# Patient Record
Sex: Male | Born: 1970 | Race: White | Hispanic: No | Marital: Married | Smoking: Never smoker
Health system: Southern US, Community
[De-identification: ages and names within clinical notes are randomized; demographics above are authoritative.]

## PROBLEM LIST (undated history)

## (undated) HISTORY — PX: HAND SURGERY: SHX662

## (undated) HISTORY — PX: HERNIA REPAIR: SHX51

---

## 1998-10-20 ENCOUNTER — Emergency Department (HOSPITAL_COMMUNITY): Admission: EM | Admit: 1998-10-20 | Discharge: 1998-10-21 | Payer: Self-pay

## 2000-12-14 ENCOUNTER — Emergency Department (HOSPITAL_COMMUNITY): Admission: EM | Admit: 2000-12-14 | Discharge: 2000-12-14 | Payer: Self-pay | Admitting: Emergency Medicine

## 2002-03-16 ENCOUNTER — Emergency Department (HOSPITAL_COMMUNITY): Admission: EM | Admit: 2002-03-16 | Discharge: 2002-03-16 | Payer: Self-pay | Admitting: Emergency Medicine

## 2002-08-02 ENCOUNTER — Emergency Department (HOSPITAL_COMMUNITY): Admission: EM | Admit: 2002-08-02 | Discharge: 2002-08-02 | Payer: Self-pay | Admitting: Emergency Medicine

## 2002-08-23 ENCOUNTER — Emergency Department (HOSPITAL_COMMUNITY): Admission: EM | Admit: 2002-08-23 | Discharge: 2002-08-24 | Payer: Self-pay | Admitting: Emergency Medicine

## 2002-11-23 ENCOUNTER — Emergency Department (HOSPITAL_COMMUNITY): Admission: EM | Admit: 2002-11-23 | Discharge: 2002-11-23 | Payer: Self-pay | Admitting: Emergency Medicine

## 2003-06-22 ENCOUNTER — Emergency Department (HOSPITAL_COMMUNITY): Admission: EM | Admit: 2003-06-22 | Discharge: 2003-06-22 | Payer: Self-pay | Admitting: Emergency Medicine

## 2006-01-29 ENCOUNTER — Emergency Department (HOSPITAL_COMMUNITY): Admission: EM | Admit: 2006-01-29 | Discharge: 2006-01-29 | Payer: Self-pay | Admitting: *Deleted

## 2006-03-21 ENCOUNTER — Emergency Department (HOSPITAL_COMMUNITY): Admission: EM | Admit: 2006-03-21 | Discharge: 2006-03-21 | Payer: Self-pay | Admitting: Emergency Medicine

## 2006-06-16 ENCOUNTER — Emergency Department (HOSPITAL_COMMUNITY): Admission: EM | Admit: 2006-06-16 | Discharge: 2006-06-16 | Payer: Self-pay | Admitting: Emergency Medicine

## 2006-09-06 ENCOUNTER — Emergency Department (HOSPITAL_COMMUNITY): Admission: EM | Admit: 2006-09-06 | Discharge: 2006-09-06 | Payer: Self-pay | Admitting: Emergency Medicine

## 2006-11-25 ENCOUNTER — Emergency Department (HOSPITAL_COMMUNITY): Admission: EM | Admit: 2006-11-25 | Discharge: 2006-11-25 | Payer: Self-pay | Admitting: Emergency Medicine

## 2007-11-24 IMAGING — CT CT PELVIS W/O CM
2 of 4 series · 17 of 46 positions shown, 19 images · IV contrast (agent unspecified)
Comparison: 06/16/2006

CLINICAL DATA: Abdominal and pelvic pain and right flank pain.   Known urinary calculi 
ABDOMEN CT WITHOUT CONTRAST:
TECHNIQUE: Multidetector CT imaging of the abdomen was performed following the standard protocol without IV contrast.
TECHNIQUE: Multidetector CT imaging of the pelvis was performed following the standard protocol without IV contrast.

[Series 2: stone_wo 5.0 b40f st · axial · 0.68mm/px · z∈[-609,-253]mm · 14 of 97 slices shown, 16 images]
[im 4/97  soft-tissue]
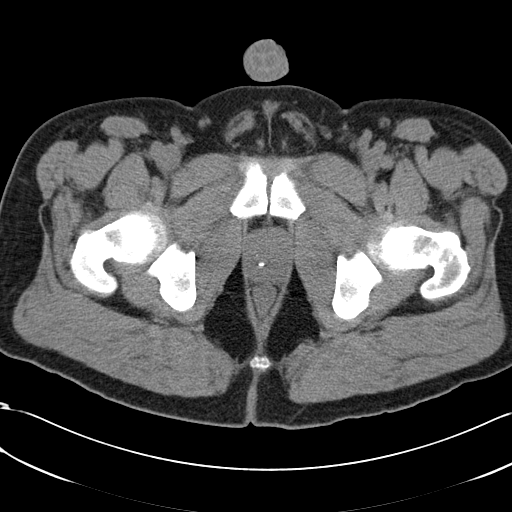
[im 4/97  bone]
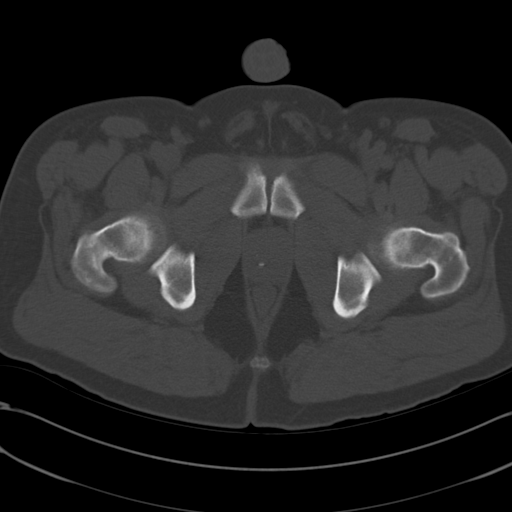
[im 11/97  soft-tissue]
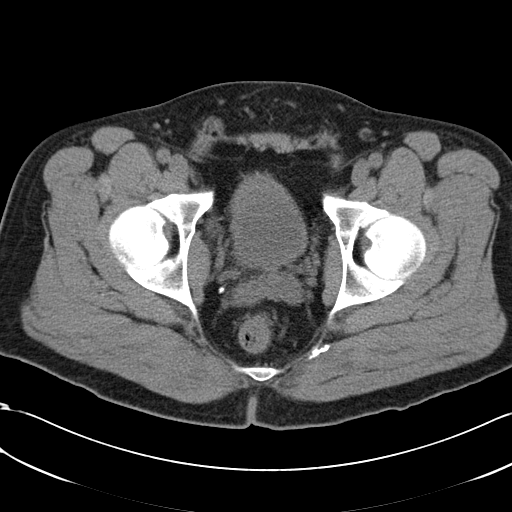
[im 18/97  soft-tissue]
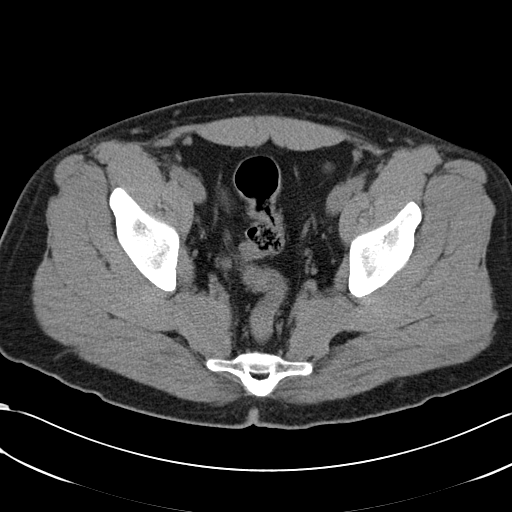
[im 25/97  soft-tissue]
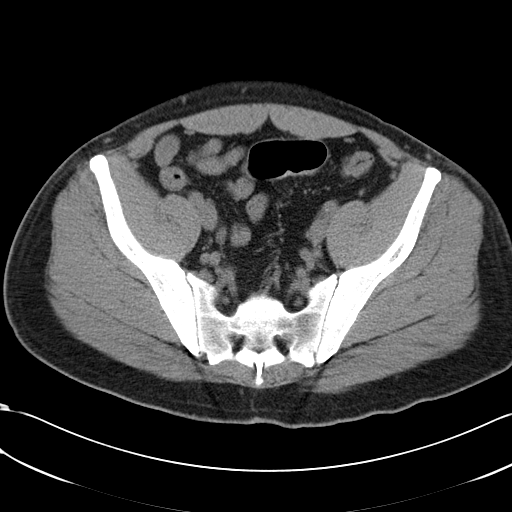
[im 33/97  soft-tissue]
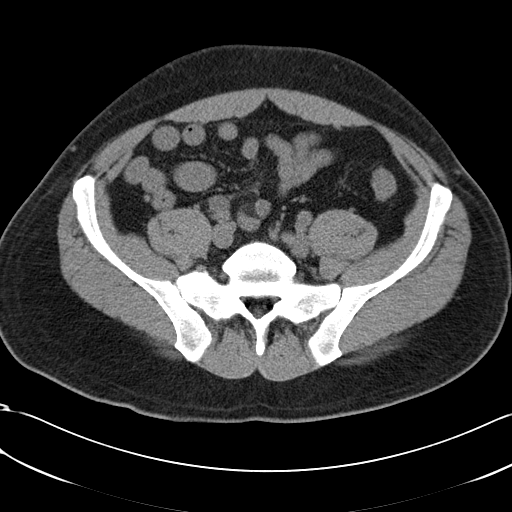
[im 40/97  soft-tissue]
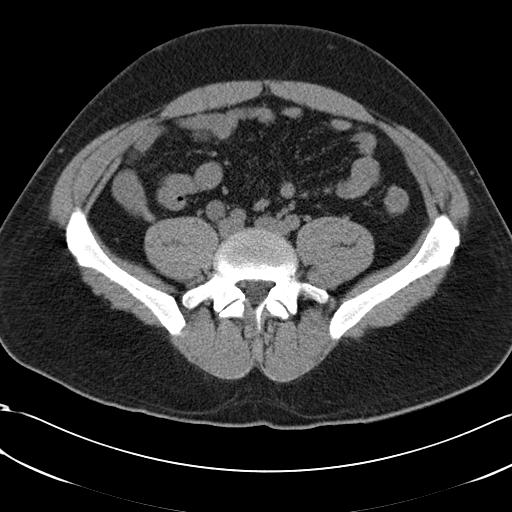
[im 47/97  soft-tissue]
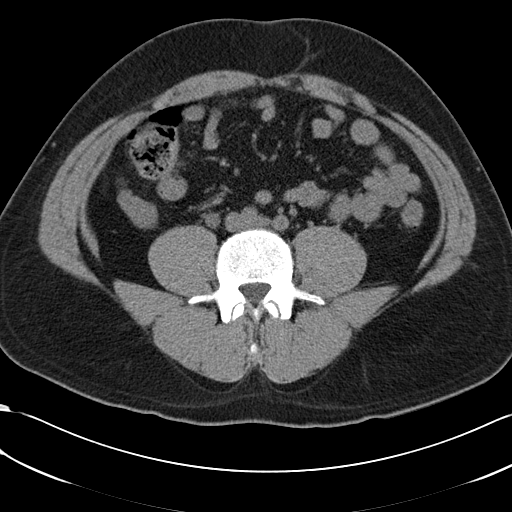
[im 50/97  soft-tissue]
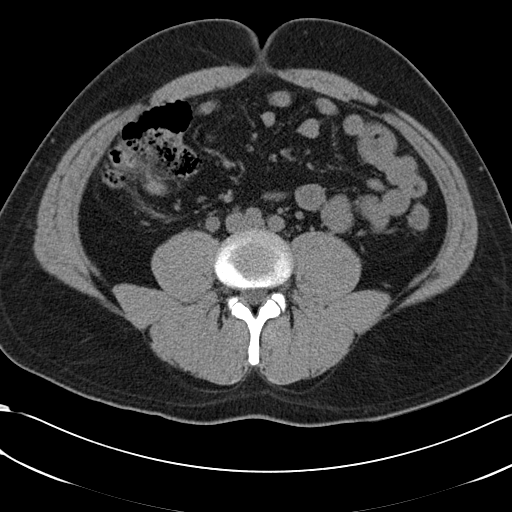
[im 57/97  soft-tissue]
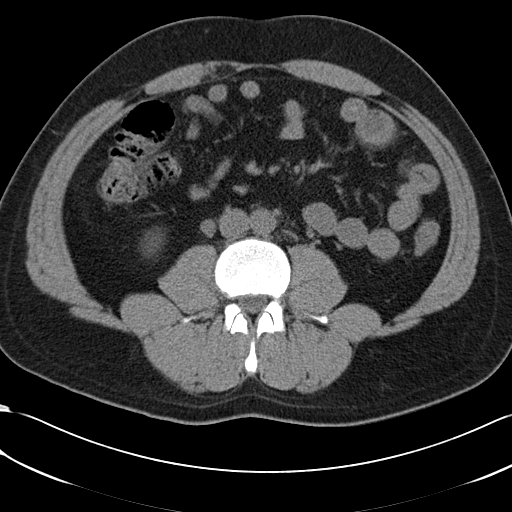
[im 57/97  bone]
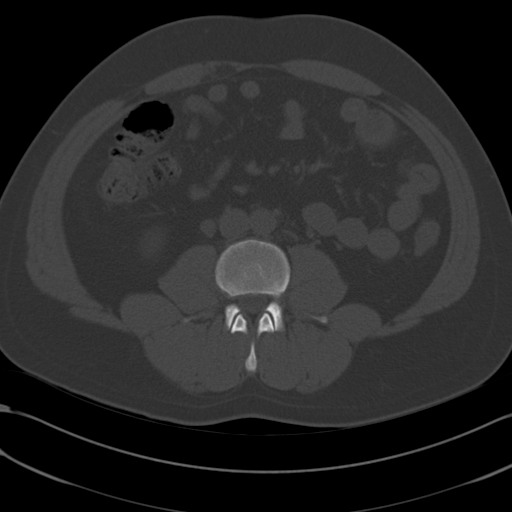
[im 65/97  soft-tissue]
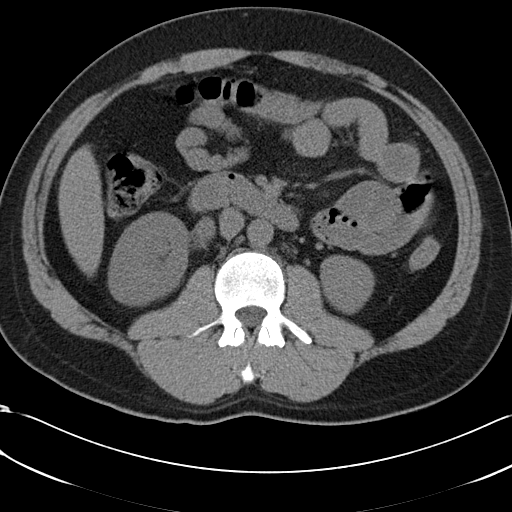
[im 72/97  soft-tissue]
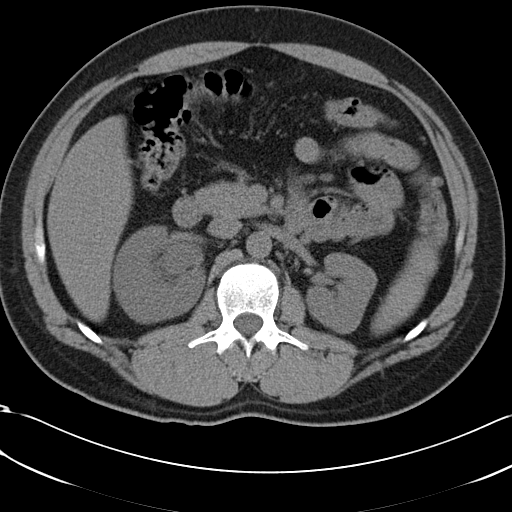
[im 79/97  soft-tissue]
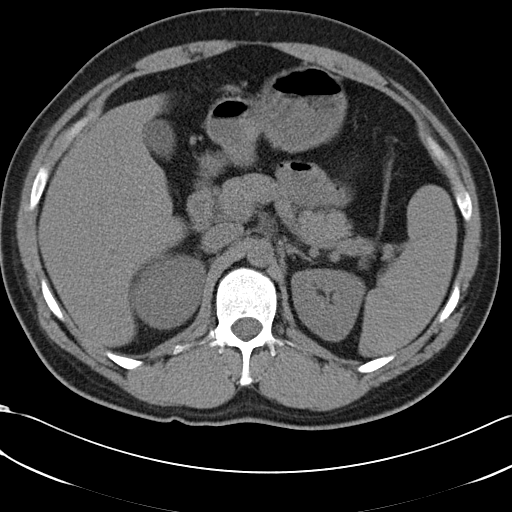
[im 86/97  soft-tissue]
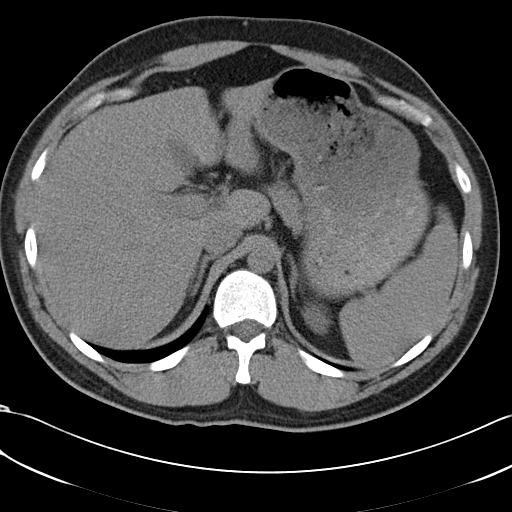
[im 93/97  soft-tissue]
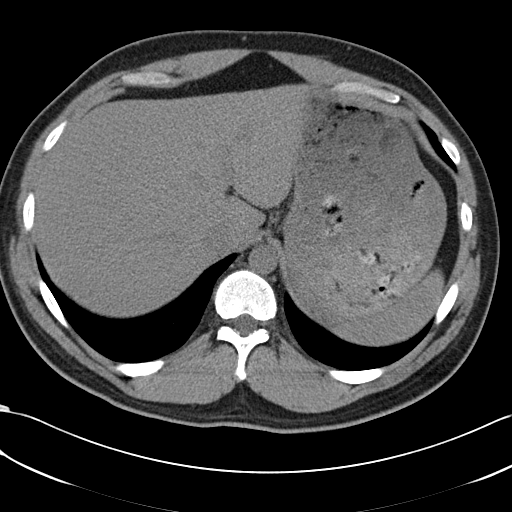

[Series 602: <mpr range> · coronal · 0.80mm/px · 3 of 74 slices shown]
[im 25/74  soft-tissue]
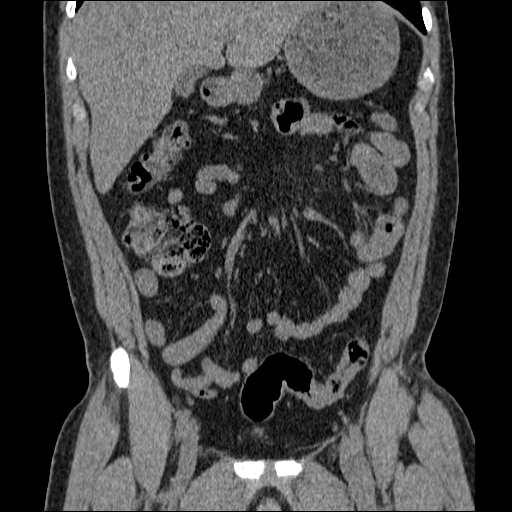
[im 33/74  soft-tissue]
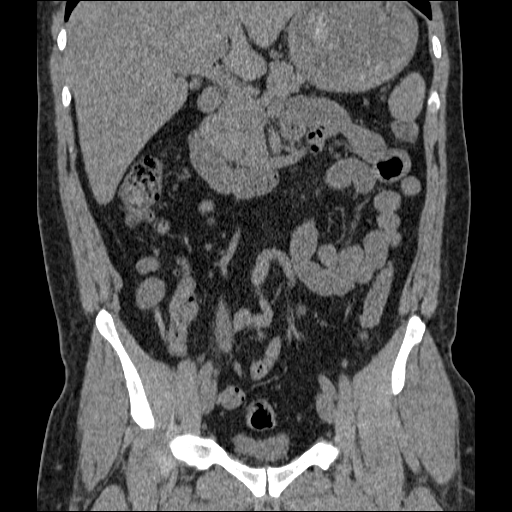
[im 41/74  soft-tissue]
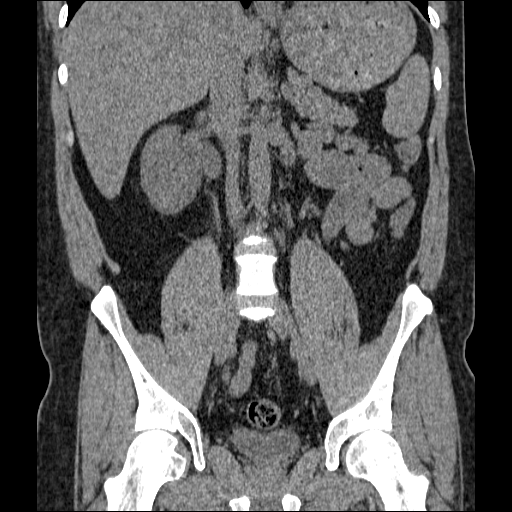

[17 of 46 positions shown; findings below may reference images not displayed]

FINDINGS: Moderate right hydroureteronephrosis is again identified caused by a 6 mm right UVJ calculus which has slightly progressed from the distal right ureter since last study. Stable punctate nonobstructing bilateral renal calculi are identified.  The visualized portions of the liver and spleen are unremarkable.  The pancreas, adrenal glands, and gallbladder are within normal limits.  Please note that parenchymal abnormalities may be missed as intravenous contrast was not administered.  No evidence of free fluid, enlarged lymph nodes, abdominal aortic aneurysm, or biliary dilatation.  The visualized bowel is within normal limits.
IMPRESSION: 1.  Stable moderate right hydroureteronephrosis caused by a 6 mm right UVJ calculus which has slightly progressed since the prior study. 
2.  Nonobstructing bilateral renal calculi.
PELVIS CT WITHOUT CONTRAST:
FINDINGS: A 6 mm right UVJ calculus has progressed approximately 4 cm from the prior study.  Moderate right hydroureteronephrosis is relatively unchanged.   No evidence of free fluid or enlarged lymph nodes.   The bowel and appendix are unremarkable.
IMPRESSION: 6 mm right UVJ calculus which has progressed from the distal right ureter since the last study.  Stable moderate right hydroureteronephrosis.

## 2008-06-11 ENCOUNTER — Emergency Department (HOSPITAL_COMMUNITY): Admission: EM | Admit: 2008-06-11 | Discharge: 2008-06-11 | Payer: Self-pay | Admitting: Emergency Medicine

## 2010-10-24 ENCOUNTER — Emergency Department (HOSPITAL_COMMUNITY)
Admission: EM | Admit: 2010-10-24 | Discharge: 2010-10-24 | Disposition: A | Discharge status: Home / Self Care | Payer: Self-pay | Attending: Emergency Medicine | Admitting: Emergency Medicine

## 2010-10-24 DIAGNOSIS — Z87442 Personal history of urinary calculi: Secondary | ICD-10-CM | POA: Insufficient documentation

## 2010-10-24 DIAGNOSIS — K089 Disorder of teeth and supporting structures, unspecified: Secondary | ICD-10-CM | POA: Insufficient documentation

## 2010-12-23 ENCOUNTER — Emergency Department (HOSPITAL_COMMUNITY)
Admission: EM | Admit: 2010-12-23 | Discharge: 2010-12-23 | Disposition: A | Discharge status: Home / Self Care | Payer: Self-pay | Attending: Emergency Medicine | Admitting: Emergency Medicine

## 2010-12-23 DIAGNOSIS — T148XXA Other injury of unspecified body region, initial encounter: Secondary | ICD-10-CM | POA: Insufficient documentation

## 2010-12-23 DIAGNOSIS — Z87442 Personal history of urinary calculi: Secondary | ICD-10-CM | POA: Insufficient documentation

## 2010-12-23 DIAGNOSIS — X58XXXA Exposure to other specified factors, initial encounter: Secondary | ICD-10-CM | POA: Insufficient documentation

## 2010-12-23 DIAGNOSIS — R109 Unspecified abdominal pain: Secondary | ICD-10-CM | POA: Insufficient documentation

## 2010-12-23 LAB — URINALYSIS, ROUTINE W REFLEX MICROSCOPIC
Bilirubin Urine: NEGATIVE
Hgb urine dipstick: NEGATIVE
Ketones, ur: NEGATIVE mg/dL
Nitrite: NEGATIVE
Urobilinogen, UA: 0.2 mg/dL (ref 0.0–1.0)

## 2011-11-10 ENCOUNTER — Encounter (HOSPITAL_COMMUNITY): Payer: Self-pay | Admitting: Emergency Medicine

## 2011-11-10 ENCOUNTER — Emergency Department (HOSPITAL_COMMUNITY)
Admission: EM | Admit: 2011-11-10 | Discharge: 2011-11-10 | Disposition: A | Discharge status: Home / Self Care | Payer: Self-pay | Attending: Emergency Medicine | Admitting: Emergency Medicine

## 2011-11-10 DIAGNOSIS — R51 Headache: Secondary | ICD-10-CM | POA: Insufficient documentation

## 2011-11-10 DIAGNOSIS — R059 Cough, unspecified: Secondary | ICD-10-CM | POA: Insufficient documentation

## 2011-11-10 DIAGNOSIS — J069 Acute upper respiratory infection, unspecified: Secondary | ICD-10-CM | POA: Insufficient documentation

## 2011-11-10 DIAGNOSIS — R509 Fever, unspecified: Secondary | ICD-10-CM | POA: Insufficient documentation

## 2011-11-10 DIAGNOSIS — R05 Cough: Secondary | ICD-10-CM | POA: Insufficient documentation

## 2011-11-10 MED ORDER — OXYMETAZOLINE HCL 0.05 % NA SOLN
1.0000 | Freq: Once | NASAL | Status: AC
  Administered 2011-11-10: 1 via NASAL
  Filled 2011-11-10: qty 15

## 2011-11-10 NOTE — ED Provider Notes (Signed)
History     CSN: [id number]  Arrival date & time 11/10/11  0608   First MD Initiated Contact with Patient 11/10/11 0703      Chief Complaint  Patient presents with  . Influenza    (Consider location/radiation/quality/duration/timing/severity/associated sxs/prior treatment) Patient is a 41 y.o. male presenting with URI. The history is provided by the patient.  URI The primary symptoms include fever, fatigue, headaches and cough. Primary symptoms do not include abdominal pain, nausea or vomiting. The current episode started 3 to 5 days ago. This is a new problem. The problem has not changed since onset. The cough began 3 to 5 days ago. The cough is non-productive.  The onset of the illness is associated with exposure to sick contacts. Symptoms associated with the illness include chills, sinus pressure, congestion and rhinorrhea.    History reviewed. No pertinent past medical history.  Past Surgical History  Procedure Date  . Hand surgery   . Hernia repair     Family History  Problem Relation Age of Onset  . Hypertension Other   . Diabetes Other     History  Substance Use Topics  . Smoking status: Never Smoker   . Smokeless tobacco: Not on file  . Alcohol Use: Yes     rare      Review of Systems  Constitutional: Positive for fever, chills and fatigue.  HENT: Positive for congestion, rhinorrhea and sinus pressure.   Respiratory: Positive for cough.   Gastrointestinal: Negative for nausea, vomiting and abdominal pain.  Neurological: Positive for headaches.  All other systems reviewed and are negative.    Allergies  Review of patient's allergies indicates no known allergies.  Home Medications  No current outpatient prescriptions on file.  BP 147/91  Pulse 62  Temp(Src) 97.9 F (36.6 C) (Oral)  Resp 18  SpO2 100%  Physical Exam  Nursing note and vitals reviewed. Constitutional: He is oriented to person, place, and time. He appears well-developed and  well-nourished. No distress.  HENT:  Head: Normocephalic and atraumatic.  Right Ear: Tympanic membrane and ear canal normal.  Left Ear: Tympanic membrane and ear canal normal.  Nose: Mucosal edema and rhinorrhea present. Right sinus exhibits no maxillary sinus tenderness and no frontal sinus tenderness. Left sinus exhibits no maxillary sinus tenderness and no frontal sinus tenderness.  Mouth/Throat: Oropharynx is clear and moist and mucous membranes are normal.  Eyes: Conjunctivae and EOM are normal. Pupils are equal, round, and reactive to light.  Neck: Normal range of motion. Neck supple.  Cardiovascular: Normal rate, regular rhythm and intact distal pulses.   No murmur heard. Pulmonary/Chest: Effort normal and breath sounds normal. No respiratory distress. He has no wheezes. He has no rales.  Musculoskeletal: Normal range of motion. He exhibits no edema and no tenderness.  Neurological: He is alert and oriented to person, place, and time.  Skin: Skin is warm and dry. No rash noted. No erythema.  Psychiatric: He has a normal mood and affect. His behavior is normal.    ED Course  Procedures (including critical care time)  Labs Reviewed - No data to display No results found.   1. URI (upper respiratory infection)       MDM   Pt with symptoms consistent with influenza.  Normal exam here afebrile.  No signs of breathing difficulty.  No signs of strep pharyngitis, otitis or abnormal abdominal findings.   Will continue antipyretica and rest and fluids and return for any further problems.  Gwyneth Sprout, MD 11/10/11 [phone]

## 2011-11-10 NOTE — ED Notes (Signed)
Pt is c/o flu like symptoms that include sore throat, congestion, headaches, some bleeding from his nose when he blows it, chills  Sxs started on Wednesday

## 2012-07-30 ENCOUNTER — Emergency Department (HOSPITAL_COMMUNITY)
Admission: EM | Admit: 2012-07-30 | Discharge: 2012-07-30 | Disposition: A | Discharge status: Home / Self Care | Payer: Self-pay | Attending: Emergency Medicine | Admitting: Emergency Medicine

## 2012-07-30 ENCOUNTER — Encounter (HOSPITAL_COMMUNITY): Payer: Self-pay | Admitting: Emergency Medicine

## 2012-07-30 DIAGNOSIS — R51 Headache: Secondary | ICD-10-CM | POA: Insufficient documentation

## 2012-07-30 DIAGNOSIS — R05 Cough: Secondary | ICD-10-CM | POA: Insufficient documentation

## 2012-07-30 DIAGNOSIS — R059 Cough, unspecified: Secondary | ICD-10-CM | POA: Insufficient documentation

## 2012-07-30 DIAGNOSIS — R0981 Nasal congestion: Secondary | ICD-10-CM

## 2012-07-30 DIAGNOSIS — R040 Epistaxis: Secondary | ICD-10-CM | POA: Insufficient documentation

## 2012-07-30 DIAGNOSIS — B9789 Other viral agents as the cause of diseases classified elsewhere: Secondary | ICD-10-CM

## 2012-07-30 DIAGNOSIS — J329 Chronic sinusitis, unspecified: Secondary | ICD-10-CM | POA: Insufficient documentation

## 2012-07-30 NOTE — ED Notes (Addendum)
Pt c/o fatigue and sinus pressure x1 week. Pt reports recent nose bleed x1

## 2012-07-30 NOTE — Progress Notes (Signed)
Late entry for 1645 CM and [employer] [facility] [employer]  community liaison spoke with him Pt offered [employer] [facility] services to assist with finding a [place] self pay providers & health reform information Pt states he has insurance but does not have insurance card in ED and no pcp at this time but knows how to obtain on through insurance carrier

## 2012-07-30 NOTE — ED Provider Notes (Signed)
Medical screening examination/treatment/procedure(s) were performed by non-physician practitioner and as supervising physician I was immediately available for consultation/collaboration.  Ethelda Chick, MD 07/30/12 [phone]

## 2012-07-30 NOTE — ED Provider Notes (Signed)
History     CSN: [id number]  Arrival date & time 07/30/12  1449   First MD Initiated Contact with Patient 07/30/12 1627      Chief Complaint  Patient presents with  . Fatigue    concerned about cold sx last weekend  . Facial Pain    sinus pressure x 1 week  . Epistaxis    one episode of nose    (Consider location/radiation/quality/duration/timing/severity/associated sxs/prior treatment) HPI Comments: 41 year old male presents the emergency department complaining of sinus pressure beginning over the weekend. After sleeping all day Saturday and Sunday, he went to work on Monday and is feeling a little better. Yesterday he went to work and began feeling congested again, took over-the-counter sinus medicine along with Tylenol with relief. His work wanted him to get checked out. States when he is outside blowing his nose there was a little bit of blood one of the times. Admits to associated slight cough from postnasal drip. Currently he is feeling much better than before. He needs a note for work in order to return.  Patient is a 41 y.o. male presenting with nosebleeds. The history is provided by the patient.  Epistaxis     History reviewed. No pertinent past medical history.  Past Surgical History  Procedure Date  . Hand surgery   . Hernia repair     Family History  Problem Relation Age of Onset  . Hypertension Other   . Diabetes Other     History  Substance Use Topics  . Smoking status: Never Smoker   . Smokeless tobacco: Not on file  . Alcohol Use: Yes     Comment: rare      Review of Systems  Constitutional: Negative for fever and chills.  HENT: Positive for nosebleeds, congestion and sinus pressure.   Eyes: Negative.   Respiratory: Positive for cough. Negative for shortness of breath.     Allergies  Review of patient's allergies indicates no known allergies.  Home Medications   Current Outpatient Rx  Name  Route  Sig  Dispense  Refill  . ACETAMINOPHEN  325 MG PO TABS   Oral   Take 650 mg by mouth every 6 (six) hours as needed. Pain         . HYDROCORTISONE 1 % EX CREA   Topical   Apply 1 application topically 2 (two) times daily.         Marland Kitchen PSEUDOEPHEDRINE HCL 30 MG PO TABS   Oral   Take 30 mg by mouth every 4 (four) hours as needed. Congestion           BP 130/80  Pulse 56  Temp 97.8 F (36.6 C) (Oral)  Resp 18  SpO2 97%  Physical Exam  Nursing note and vitals reviewed. Constitutional: He is oriented to person, place, and time. He appears well-developed and well-nourished. No distress.  HENT:  Head: Normocephalic and atraumatic.  Right Ear: Hearing, tympanic membrane, external ear and ear canal normal.  Left Ear: Hearing, tympanic membrane, external ear and ear canal normal.  Nose: Mucosal edema present. No epistaxis. Right sinus exhibits no maxillary sinus tenderness and no frontal sinus tenderness. Left sinus exhibits no maxillary sinus tenderness and no frontal sinus tenderness.  Mouth/Throat: Uvula is midline and mucous membranes are normal. No posterior oropharyngeal edema or posterior oropharyngeal erythema.       Clear post nasal drip present.  Eyes: Conjunctivae normal are normal.  Neck: Normal range of motion. Neck supple.  Cardiovascular:  Normal rate, regular rhythm and normal heart sounds.   Pulmonary/Chest: Effort normal and breath sounds normal. He has no wheezes.  Musculoskeletal: Normal range of motion.  Lymphadenopathy:    He has no cervical adenopathy.  Neurological: He is alert and oriented to person, place, and time.  Skin: Skin is warm and dry.  Psychiatric: He has a normal mood and affect. His behavior is normal.    ED Course  Procedures (including critical care time)  Labs Reviewed - No data to display No results found.   1. Viral sinusitis   2. Head congestion       MDM  41 year old male with congestion. No signs of infection. His sinuses are nontender. Afebrile with normal vital  signs. He needs a note for work. I will give him a note stating he is able to return to work. Discussed conservative measures and detail.        Trevor Mace, PA-C 07/30/12 1711

## 2013-09-09 ENCOUNTER — Emergency Department (HOSPITAL_COMMUNITY)
Admission: EM | Admit: 2013-09-09 | Discharge: 2013-09-09 | Disposition: A | Discharge status: Home / Self Care | Payer: Self-pay | Attending: Emergency Medicine | Admitting: Emergency Medicine

## 2013-09-09 ENCOUNTER — Encounter (HOSPITAL_COMMUNITY): Payer: Self-pay | Admitting: Emergency Medicine

## 2013-09-09 DIAGNOSIS — Y9389 Activity, other specified: Secondary | ICD-10-CM | POA: Insufficient documentation

## 2013-09-09 DIAGNOSIS — S335XXA Sprain of ligaments of lumbar spine, initial encounter: Secondary | ICD-10-CM | POA: Insufficient documentation

## 2013-09-09 DIAGNOSIS — Y929 Unspecified place or not applicable: Secondary | ICD-10-CM | POA: Insufficient documentation

## 2013-09-09 DIAGNOSIS — S39012A Strain of muscle, fascia and tendon of lower back, initial encounter: Secondary | ICD-10-CM

## 2013-09-09 DIAGNOSIS — X500XXA Overexertion from strenuous movement or load, initial encounter: Secondary | ICD-10-CM | POA: Insufficient documentation

## 2013-09-09 MED ORDER — HYDROCODONE-ACETAMINOPHEN 5-325 MG PO TABS: 1.0000 | ORAL_TABLET | Freq: Four times a day (QID) | ORAL | Status: DC | PRN

## 2013-09-09 MED ORDER — IBUPROFEN 800 MG PO TABS: 800.0000 mg | ORAL_TABLET | Freq: Three times a day (TID) | ORAL | Status: DC | PRN

## 2013-09-09 MED ORDER — CYCLOBENZAPRINE HCL 10 MG PO TABS: 10.0000 mg | ORAL_TABLET | Freq: Three times a day (TID) | ORAL | Status: DC | PRN

## 2013-09-09 NOTE — ED Provider Notes (Signed)
CSN: [id number]     Arrival date & time 09/09/13  1416 History  This chart was scribed for non-physician practitioner, Trixie Dredge, PA-C working with Rolan Bucco, MD by Greggory Stallion, ED scribe. This patient was seen in room WTR6/WTR6 and the patient's care was started at 4:30 PM.   Chief Complaint  Patient presents with  . Back Pain   The history is provided by the patient. No language interpreter was used.   HPI Comments: Anthony Livingston is a 42 y.o. male who presents to the Emergency Department complaining of sudden onset, constant lower back pain that started yesterday. He states he was bending over and heard a pop. States he wasn't doing any heavy lifting. He has taken someone else's percocet with some relief. Denies fever, abdominal pain, constipation, dysuria, hematuria, urinary frequency, urgency, saddle anesthesia, bowel or bladder incontinence, numbness or weakness in legs. Pt denies history of back problems.  History reviewed. No pertinent past medical history. Past Surgical History  Procedure Laterality Date  . Hand surgery    . Hernia repair     Family History  Problem Relation Age of Onset  . Hypertension Other   . Diabetes Other    History  Substance Use Topics  . Smoking status: Never Smoker   . Smokeless tobacco: Not on file  . Alcohol Use: Yes     Comment: rare    Review of Systems  Constitutional: Negative for fever.  Gastrointestinal: Negative for abdominal pain and constipation.  Genitourinary: Negative for dysuria, urgency, frequency and hematuria.       Negative for bowel or bladder incontinence.   Musculoskeletal: Positive for back pain.  Neurological: Negative for weakness and numbness.  All other systems reviewed and are negative.    Allergies  Review of patient's allergies indicates no known allergies.  Home Medications  No current outpatient prescriptions on file.  BP 127/79  Pulse 76  Temp(Src) 98.2 F (36.8 C) (Oral)  SpO2  97%  Physical Exam  Nursing note and vitals reviewed. Constitutional: He appears well-developed and well-nourished. No distress.  HENT:  Head: Normocephalic and atraumatic.  Neck: Neck supple.  Pulmonary/Chest: Effort normal.  Abdominal: Soft. He exhibits no distension. There is no tenderness. There is no rebound and no guarding.  Musculoskeletal:  Spine nontender, no crepitus, or stepoffs.  No tenderness of back.  No skin changes.   Neurological: He is alert.  Lower extremities:  Strength 5/5, sensation intact, distal pulses intact.     Skin: He is not diaphoretic.    ED Course  Procedures (including critical care time)  DIAGNOSTIC STUDIES: Oxygen Saturation is 97% on RA, normal by my interpretation.    COORDINATION OF CARE: 4:34 PM-Discussed treatment plan which includes pain medication with pt at bedside and pt agreed to plan.   Labs Review Labs Reviewed - No data to display Imaging Review No results found.  EKG Interpretation   None       MDM   1. Low back strain, initial encounter    Patient with mechanical low back pain that appears to be musculoskeletal as there hearing a pop while bending over. The pain does not radiate. He is neurovascularly intact. No red flags for back pain.   Patient discharged home with ibuprofen, Flexeril, small amount of Norco.  PCP follow up.  Discussed result, findings, treatment, and follow up  with patient.  Pt given return precautions.  Pt verbalizes understanding and agrees with plan.      I  personally performed the services described in this documentation, which was scribed in my presence. The recorded information has been reviewed and is accurate.   Trixie Dredge, PA-C 09/09/13 1800

## 2013-09-09 NOTE — ED Notes (Signed)
Pt states that yesterday he was bending over and he heard a pop in his back. Never had back pain before. Lower back. Denies numbness or tingling.

## 2013-09-09 NOTE — ED Provider Notes (Signed)
Medical screening examination/treatment/procedure(s) were performed by non-physician practitioner and as supervising physician I was immediately available for consultation/collaboration.  EKG Interpretation   None         Marieelena Bartko, MD 09/09/13 1929 

## 2013-11-27 ENCOUNTER — Emergency Department (HOSPITAL_COMMUNITY)
Admission: EM | Admit: 2013-11-27 | Discharge: 2013-11-27 | Disposition: A | Discharge status: Home / Self Care | Payer: Self-pay | Attending: Emergency Medicine | Admitting: Emergency Medicine

## 2013-11-27 ENCOUNTER — Encounter (HOSPITAL_COMMUNITY): Payer: Self-pay | Admitting: Emergency Medicine

## 2013-11-27 ENCOUNTER — Emergency Department (HOSPITAL_COMMUNITY): Payer: Self-pay

## 2013-11-27 DIAGNOSIS — S139XXA Sprain of joints and ligaments of unspecified parts of neck, initial encounter: Secondary | ICD-10-CM | POA: Insufficient documentation

## 2013-11-27 DIAGNOSIS — X500XXA Overexertion from strenuous movement or load, initial encounter: Secondary | ICD-10-CM | POA: Insufficient documentation

## 2013-11-27 DIAGNOSIS — M791 Myalgia, unspecified site: Secondary | ICD-10-CM

## 2013-11-27 DIAGNOSIS — Y9389 Activity, other specified: Secondary | ICD-10-CM | POA: Insufficient documentation

## 2013-11-27 DIAGNOSIS — S161XXA Strain of muscle, fascia and tendon at neck level, initial encounter: Secondary | ICD-10-CM

## 2013-11-27 DIAGNOSIS — Y929 Unspecified place or not applicable: Secondary | ICD-10-CM | POA: Insufficient documentation

## 2013-11-27 MED ORDER — CYCLOBENZAPRINE HCL 10 MG PO TABS: 10.0000 mg | ORAL_TABLET | Freq: Two times a day (BID) | ORAL | Status: AC | PRN

## 2013-11-27 NOTE — Discharge Instructions (Signed)
Please call and set-up an appointment with health and wellness center Please call and set-up an appointment with orthopedics if neck pain continues Please rest and stay hydrated Please avoid any physical or strenuous activity Please take medications as prescribed Please apply icy-hot ointment and heat to aid in muscular relief Please continue to monitor symptoms closely and if symptoms are to worsen or change (fever greater than 101, chills, sweating, chest pain, shortness of breath, difficulty breathing, numbness, tingling, weakness, headaches, dizziness, fall, injury) please report back to the ED immediately   Cervical Strain and Sprain (Whiplash) with Rehab Cervical strain and sprains are injuries that commonly occur with "whiplash" injuries. Whiplash occurs when the neck is forcefully whipped backward or forward, such as during a motor vehicle accident. The muscles, ligaments, tendons, discs and nerves of the neck are susceptible to injury when this occurs. SYMPTOMS   Pain or stiffness in the front and/or back of neck  Symptoms may present immediately or up to 24 hours after injury.  Dizziness, headache, nausea and vomiting.  Muscle spasm with soreness and stiffness in the neck.  Tenderness and swelling at the injury site. CAUSES  Whiplash injuries often occur during contact sports or motor vehicle accidents.  RISK INCREASES WITH:  Osteoarthritis of the spine.  Situations that make head or neck accidents or trauma more likely.  High-risk sports (football, rugby, wrestling, hockey, auto racing, gymnastics, diving, contact karate or boxing).  Poor strength and flexibility of the neck.  Previous neck injury.  Poor tackling technique.  Improperly fitted or padded equipment. PREVENTION  Learn and use proper technique (avoid tackling with the head, spearing and head-butting; use proper falling techniques to avoid landing on the head).  Warm up and stretch properly before  activity.  Maintain physical fitness:  Strength, flexibility and endurance.  Cardiovascular fitness.  Wear properly fitted and padded protective equipment, such as padded soft collars, for participation in contact sports. PROGNOSIS  Recovery for cervical strain and sprain injuries is dependent on the extent of the injury. These injuries are usually curable in 1 week to 3 months with appropriate treatment.  RELATED COMPLICATIONS   Temporary numbness and weakness may occur if the nerve roots are damaged, and this may persist until the nerve has completely healed.  Chronic pain due to frequent recurrence of symptoms.  Prolonged healing, especially if activity is resumed too soon (before complete recovery). TREATMENT  Treatment initially involves the use of ice and medication to help reduce pain and inflammation. It is also important to perform strengthening and stretching exercises and modify activities that worsen symptoms so the injury does not get worse. These exercises may be performed at home or with a therapist. For patients who experience severe symptoms, a soft padded collar may be recommended to be worn around the neck.  Improving your posture may help reduce symptoms. Posture improvement includes pulling your chin and abdomen in while sitting or standing. If you are sitting, sit in a firm chair with your buttocks against the back of the chair. While sleeping, try replacing your pillow with a small towel rolled to 2 inches in diameter, or use a cervical pillow or soft cervical collar. Poor sleeping positions delay healing.  For patients with nerve root damage, which causes numbness or weakness, the use of a cervical traction apparatus may be recommended. Surgery is rarely necessary for these injuries. However, cervical strain and sprains that are present at birth (congenital) may require surgery. MEDICATION   If pain medication  is necessary, nonsteroidal anti-inflammatory medications,  such as aspirin and ibuprofen, or other minor pain relievers, such as acetaminophen, are often recommended.  Do not take pain medication for 7 days before surgery.  Prescription pain relievers may be given if deemed necessary by your caregiver. Use only as directed and only as much as you need. HEAT AND COLD:   Cold treatment (icing) relieves pain and reduces inflammation. Cold treatment should be applied for 10 to 15 minutes every 2 to 3 hours for inflammation and pain and immediately after any activity that aggravates your symptoms. Use ice packs or an ice massage.  Heat treatment may be used prior to performing the stretching and strengthening activities prescribed by your caregiver, physical therapist, or athletic trainer. Use a heat pack or a warm soak. SEEK MEDICAL CARE IF:   Symptoms get worse or do not improve in 2 weeks despite treatment.  New, unexplained symptoms develop (drugs used in treatment may produce side effects). EXERCISES RANGE OF MOTION (ROM) AND STRETCHING EXERCISES - Cervical Strain and Sprain These exercises may help you when beginning to rehabilitate your injury. In order to successfully resolve your symptoms, you must improve your posture. These exercises are designed to help reduce the forward-head and rounded-shoulder posture which contributes to this condition. Your symptoms may resolve with or without further involvement from your physician, physical therapist or athletic trainer. While completing these exercises, remember:   Restoring tissue flexibility helps normal motion to return to the joints. This allows healthier, less painful movement and activity.  An effective stretch should be held for at least 20 seconds, although you may need to begin with shorter hold times for comfort.  A stretch should never be painful. You should only feel a gentle lengthening or release in the stretched tissue. STRETCH- Axial Extensors  Lie on your back on the floor. You may  bend your knees for comfort. Place a rolled up hand towel or dish towel, about 2 inches in diameter, under the part of your head that makes contact with the floor.  Gently tuck your chin, as if trying to make a "double chin," until you feel a gentle stretch at the base of your head.  Hold __________ seconds. Repeat __________ times. Complete this exercise __________ times per day.  STRETECH - Axial Extension   Stand or sit on a firm surface. Assume a good posture: chest up, shoulders drawn back, abdominal muscles slightly tense, knees unlocked (if standing) and feet hip width apart.  Slowly retract your chin so your head slides back and your chin slightly lowers.Continue to look straight ahead.  You should feel a gentle stretch in the back of your head. Be certain not to feel an aggressive stretch since this can cause headaches later.  Hold for __________ seconds. Repeat __________ times. Complete this exercise __________ times per day. STRETCH  Cervical Side Bend   Stand or sit on a firm surface. Assume a good posture: chest up, shoulders drawn back, abdominal muscles slightly tense, knees unlocked (if standing) and feet hip width apart.  Without letting your nose or shoulders move, slowly tip your right / left ear to your shoulder until your feel a gentle stretch in the muscles on the opposite side of your neck.  Hold __________ seconds. Repeat __________ times. Complete this exercise __________ times per day. STRETCH  Cervical Rotators   Stand or sit on a firm surface. Assume a good posture: chest up, shoulders drawn back, abdominal muscles slightly tense, knees unlocked (  if standing) and feet hip width apart.  Keeping your eyes level with the ground, slowly turn your head until you feel a gentle stretch along the back and opposite side of your neck.  Hold __________ seconds. Repeat __________ times. Complete this exercise __________ times per day. RANGE OF MOTION - Neck Circles     Stand or sit on a firm surface. Assume a good posture: chest up, shoulders drawn back, abdominal muscles slightly tense, knees unlocked (if standing) and feet hip width apart.  Gently roll your head down and around from the back of one shoulder to the back of the other. The motion should never be forced or painful.  Repeat the motion 10-20 times, or until you feel the neck muscles relax and loosen. Repeat __________ times. Complete the exercise __________ times per day. STRENGTHENING EXERCISES - Cervical Strain and Sprain These exercises may help you when beginning to rehabilitate your injury. They may resolve your symptoms with or without further involvement from your physician, physical therapist or athletic trainer. While completing these exercises, remember:   Muscles can gain both the endurance and the strength needed for everyday activities through controlled exercises.  Complete these exercises as instructed by your physician, physical therapist or athletic trainer. Progress the resistance and repetitions only as guided.  You may experience muscle soreness or fatigue, but the pain or discomfort you are trying to eliminate should never worsen during these exercises. If this pain does worsen, stop and make certain you are following the directions exactly. If the pain is still present after adjustments, discontinue the exercise until you can discuss the trouble with your clinician. STRENGTH Cervical Flexors, Isometric  Face a wall, standing about 6 inches away. Place a small pillow, a ball about 6-8 inches in diameter, or a folded towel between your forehead and the wall.  Slightly tuck your chin and gently push your forehead into the soft object. Push only with mild to moderate intensity, building up tension gradually. Keep your jaw and forehead relaxed.  Hold 10 to 20 seconds. Keep your breathing relaxed.  Release the tension slowly. Relax your neck muscles completely before you start  the next repetition. Repeat __________ times. Complete this exercise __________ times per day. STRENGTH- Cervical Lateral Flexors, Isometric   Stand about 6 inches away from a wall. Place a small pillow, a ball about 6-8 inches in diameter, or a folded towel between the side of your head and the wall.  Slightly tuck your chin and gently tilt your head into the soft object. Push only with mild to moderate intensity, building up tension gradually. Keep your jaw and forehead relaxed.  Hold 10 to 20 seconds. Keep your breathing relaxed.  Release the tension slowly. Relax your neck muscles completely before you start the next repetition. Repeat __________ times. Complete this exercise __________ times per day. STRENGTH  Cervical Extensors, Isometric   Stand about 6 inches away from a wall. Place a small pillow, a ball about 6-8 inches in diameter, or a folded towel between the back of your head and the wall.  Slightly tuck your chin and gently tilt your head back into the soft object. Push only with mild to moderate intensity, building up tension gradually. Keep your jaw and forehead relaxed.  Hold 10 to 20 seconds. Keep your breathing relaxed.  Release the tension slowly. Relax your neck muscles completely before you start the next repetition. Repeat __________ times. Complete this exercise __________ times per day. POSTURE AND BODY MECHANICS  CONSIDERATIONS - Cervical Strain and Sprain Keeping correct posture when sitting, standing or completing your activities will reduce the stress put on different body tissues, allowing injured tissues a chance to heal and limiting painful experiences. The following are general guidelines for improved posture. Your physician or physical therapist will provide you with any instructions specific to your needs. While reading these guidelines, remember:  The exercises prescribed by your provider will help you have the flexibility and strength to maintain correct  postures.  The correct posture provides the optimal environment for your joints to work. All of your joints have less wear and tear when properly supported by a spine with good posture. This means you will experience a healthier, less painful body.  Correct posture must be practiced with all of your activities, especially prolonged sitting and standing. Correct posture is as important when doing repetitive low-stress activities (typing) as it is when doing a single heavy-load activity (lifting). PROLONGED STANDING WHILE SLIGHTLY LEANING FORWARD When completing a task that requires you to lean forward while standing in one place for a long time, place either foot up on a stationary 2-4 inch high object to help maintain the best posture. When both feet are on the ground, the low back tends to lose its slight inward curve. If this curve flattens (or becomes too large), then the back and your other joints will experience too much stress, fatigue more quickly and can cause pain.  RESTING POSITIONS Consider which positions are most painful for you when choosing a resting position. If you have pain with flexion-based activities (sitting, bending, stooping, squatting), choose a position that allows you to rest in a less flexed posture. You would want to avoid curling into a fetal position on your side. If your pain worsens with extension-based activities (prolonged standing, working overhead), avoid resting in an extended position such as sleeping on your stomach. Most people will find more comfort when they rest with their spine in a more neutral position, neither too rounded nor too arched. Lying on a non-sagging bed on your side with a pillow between your knees, or on your back with a pillow under your knees will often provide some relief. Keep in mind, being in any one position for a prolonged period of time, no matter how correct your posture, can still lead to stiffness. WALKING Walk with an upright posture.  Your ears, shoulders and hips should all line-up. OFFICE WORK When working at a desk, create an environment that supports good, upright posture. Without extra support, muscles fatigue and lead to excessive strain on joints and other tissues. CHAIR:  A chair should be able to slide under your desk when your back makes contact with the back of the chair. This allows you to work closely.  The chair's height should allow your eyes to be level with the upper part of your monitor and your hands to be slightly lower than your elbows.  Body position:  Your feet should make contact with the floor. If this is not possible, use a foot rest.  Keep your ears over your shoulders. This will reduce stress on your neck and low back. Document Released: 08/27/2005 Document Revised: 12/22/2012 Document Reviewed: 12/09/2008 [facility] Patient Information 2014 [place], [place].   Emergency Department Resource Guide 1) Find a Doctor and Pay Out of Pocket Although you won't have to find out who is covered by your insurance plan, it is a good idea to ask around and get recommendations. You will then need  to call the office and see if the doctor you have chosen will accept you as a new patient and what types of options they offer for patients who are self-pay. Some doctors offer discounts or will set up payment plans for their patients who do not have insurance, but you will need to ask so you aren't surprised when you get to your appointment.  2) Contact Your Local Health Department Not all health departments have doctors that can see patients for sick visits, but many do, so it is worth a call to see if yours does. If you don't know where your local health department is, you can check in your phone book. The CDC also has a tool to help you locate your state's health department, and many state websites also have listings of all of their local health departments.  3) Find a Walk-in Clinic If your illness is not likely  to be very severe or complicated, you may want to try a walk in clinic. These are popping up all over the country in pharmacies, drugstores, and shopping centers. They're usually staffed by nurse practitioners or physician assistants that have been trained to treat common illnesses and complaints. They're usually fairly quick and inexpensive. However, if you have serious medical issues or chronic medical problems, these are probably not your best option.  No Primary Care Doctor: - Call Health Connect at  [phone] - they can help you locate a primary care doctor that  accepts your insurance, provides certain services, etc. - Physician Referral Service- [phone]  Chronic Pain Problems: Organization         Address  Phone   Notes  [facility]  [phone] Patients need to be referred by their primary care doctor.   Medication Assistance: Organization         Address  Phone   Notes  [facility] Medication [facility] [address] [place], [place] [address] [phone] --Must be a resident of [facility] -- Must have NO insurance coverage whatsoever (no Medicaid/ Medicare, etc.) -- The pt. MUST have a primary care doctor that directs their care regularly and follows them in the community   [facility]  [phone]   [employer]  [phone]    Agencies that provide inexpensive medical care: Organization         Address  Phone   Notes  [facility]  [phone]   [facility]    [phone]   [facility] [address] [place], [place] [address] [phone]   [facility] [address] [place] [phone]   Planned Parenthood    [phone]   [facility]    [phone]   [facility]  [address], [place] Phone:  [phone], Fax:  [phone] Hours of Operation:  9 am - 6 pm, M-F.   Also accepts Medicaid/Medicare and self-pay.  [facility]  [address], [place] Phone: [phone], Fax: [phone]. Hours of Operation:  8:30 am - 5:30 pm, M-F.  Also accepts Medicaid and self-pay.  [facility] [place] [address], [place] Phone: [phone]   [facility] [address] Natasha Bence [place], [place] [phone], Ext. [phone] Mondays & Thursdays: 7-9 AM.  First 15 patients are seen on a first come, first serve basis.  Medicaid-accepting [facility] Providers:  Organization         Address  Phone   Notes  [facility] [address], [place] [phone] Also accepts self-pay patients.  [facility] [address] Laurell Josephs [place], [place]  [phone]   [facility] [address], [place] [phone]   [facility] [address], [place] [phone]   Renaye Rakers [address], [place]   [phone] Only accepts [place] patients after they have their name applied to their card.   Self-Pay (no insurance) in [facility]:  Organization         Address  Phone   Notes  Sickle Cell Patients, [facility] Medicine [address] [place], [place] [phone]   [facility] [address] [place], [place] [phone]   [facility] [place]  [address], [place] [phone]   [facility]/Dr. Osei-Bonsu  [address], [place] or [address], [place] [phone] Phone number for both [place] and [place] locations is the same.  [facility] [address], [place] [phone]   [facility] [address] [place] or [address] [phone]   [facility] [address]  [place] [phone], phone; [phone], fax Sees patients 1st and 3rd Saturday of every month.  Must not qualify for public or private insurance (i.e. Medicaid, Medicare, [place] Health Choice, Veterans' Benefits)  Household income should be no more than 200% of the poverty level The clinic cannot treat you if you are pregnant or think you are pregnant  Sexually transmitted diseases are not treated at the clinic.    Dental Care: Organization         Address  Phone  Notes  [facility] [address] [place], [place] [phone] Accepts children up to age 62 who are enrolled in [place] or [place] Health Choice; pregnant women with a Medicaid card; and children who have applied for Medicaid or [place] Health Choice, but were declined, whose parents can pay a reduced fee at time of service.  [facility]  [address], [place] [phone] Accepts children up to age 15 who are enrolled in [place] or [place] Health Choice; pregnant women with a Medicaid card; and children who have applied for Medicaid or  Health Choice, but were declined, whose parents can pay a reduced fee at time of service.  [facility]  [address] [place], [place] [phone] Patients are seen by appointment only. Walk-ins are not accepted. [facility] will see patients 67 years of age and older. Monday - Tuesday (8am-5pm) Most Wednesdays (8:30-5pm) $30 per visit, cash only  [facility]  [address], [facility] [phone] Patients are seen by appointment only. Walk-ins are not accepted. [facility] will see patients 67 years of age and older. One Wednesday Evening (Monthly: Volunteer Based).  $30 per visit, cash only  [employer]  [phone] for adults; Children under age 75, call Graduate Pediatric Dentistry at [phone].  Children aged 6-14, please call [phone] to request a  pediatric application.  Dental services are provided in all areas of dental care including fillings, crowns and bridges, complete and partial dentures, implants, gum treatment, root canals, and extractions. Preventive care is also provided. Treatment is provided to both adults and children. Patients are selected via a lottery and there is often a waiting list.   [facility] [address] [place]  [phone] [email or web address]   [facility] [address] [place], [place] [phone], Ext. [phone] Second and Fourth Thursday of each month, opens at 6:30 AM; Clinic ends at 9 AM.  Patients are seen on a first-come first-served basis, and a limited number are seen during each clinic.   [facility]  [address] Ether Griffins [place], [place] [phone]   Eligibility Requirements You must have lived in [place], [place], or [place] counties for at least the last three months.   You cannot be eligible for state or federal sponsored [place], including [employer], [place], or [employer].   You generally cannot be eligible for healthcare insurance through your employer.    How to apply: Eligibility screenings are held every Tuesday and Wednesday afternoon from 1:00 pm until 4:00 pm. You do not need an appointment for the interview!  [facility] [address] [place], [place] [phone]   [facility]  [phone]   [facility]  [phone]   [facility]  [phone]    Behavioral Health Resources in the Community: Intensive Outpatient Programs Organization         Address  Phone  Notes  [facility] [address], [place], [place] [phone]   [facility] Outpatient [address] [place], [place] [phone]   [facility] [address] [place], [place]  [phone]   [facility] [address],  [place], [place] [phone] or [phone]   Substance Abuse Resources Organization         Address  Phone  Notes  [facility]  [phone]   [facility] [facility]  [phone]   [facility]  [phone]   Floydene Flock  [phone]   Residential & Outpatient Substance Abuse Program  [phone]   Psychological Services Organization         Address  Phone  Notes  [facility] Behavioral Health  [phone]   [facility] Services  [phone]   [facility] Mental [facility] [address] [place] [phone] or [phone]    Mobile Crisis Teams Organization         Address  Phone  Notes  [facility], Mobile Crisis Care Unit  [phone]   [facility]  [address] [place], [place] [phone]   Doristine Locks [address] [place] [phone]    Self-Help/Support Groups Organization         Address  Phone             Notes  [facility] [place] - variety of support groups  [phone] Call for more information  Narcotics Anonymous (NA), [facility] [address], [employer] [place]  2 meetings at this location   Statistician         Address  Phone  Notes  [facility] [address],    [place]  [phone]   [facility]  [address], [place] [phone],  [place], [place] [phone]   [facility] [address] [place], [place] [phone] Admissions: 8am-3pm M-F  [facility] [address],    [place], [place] [phone]   [facility] [address] [place], [place], [place] [phone]   [facility] [address],  [place], [place] [phone]   [facility] - Intensive Outpatient [address], [place], [place] [phone]     [facility] ([facility] [address]  [place], [place] [phone] or [phone]   [facility] ([facility] [address], [place], [place] [phone] Accepts Medicaid  [facility] [address],  [place] [phone] Substance Abuse/Addiction Treatment   [facility] Organization         Address  Phone  Notes  [facility]  [phone]   Angie Fava, PhD [address] Ervin Knack [place], [place]   [phone] or [phone]   [facility]   [address] [place], [place] [phone]   [facility] [address], [place], [place] [phone] Insurance/Medicaid/sponsorship through [facility] [address]                                    [place], [place] [phone] Therapy/tele-psych/case  [facility] [address], [place] [phone]    Dr. Lolly Mustache  [phone]   [facility]  [employer] [facility] Dept. 1) [address] [place] 2) [address] [place] 3)  [address], [place] [phone]  [phone]   [facility] Child Abuse Hotline [phone] or [phone] (After Hours)
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
## 2016-05-07 ENCOUNTER — Emergency Department (HOSPITAL_COMMUNITY): Payer: Self-pay

## 2016-05-07 ENCOUNTER — Emergency Department (HOSPITAL_COMMUNITY)
Admission: EM | Admit: 2016-05-07 | Discharge: 2016-05-08 | Disposition: A | Discharge status: Home / Self Care | Payer: Self-pay | Attending: Emergency Medicine | Admitting: Emergency Medicine

## 2016-05-07 ENCOUNTER — Encounter (HOSPITAL_COMMUNITY): Payer: Self-pay | Admitting: Emergency Medicine

## 2016-05-07 DIAGNOSIS — F17220 Nicotine dependence, chewing tobacco, uncomplicated: Secondary | ICD-10-CM | POA: Insufficient documentation

## 2016-05-07 DIAGNOSIS — R9431 Abnormal electrocardiogram [ECG] [EKG]: Secondary | ICD-10-CM

## 2016-05-07 DIAGNOSIS — R079 Chest pain, unspecified: Secondary | ICD-10-CM

## 2016-05-07 DIAGNOSIS — R0789 Other chest pain: Secondary | ICD-10-CM | POA: Insufficient documentation

## 2016-05-07 LAB — BASIC METABOLIC PANEL
Anion gap: 8 (ref 5–15)
BUN: 16 mg/dL (ref 6–20)
CALCIUM: 10.3 mg/dL (ref 8.9–10.3)
CO2: 25 mmol/L (ref 22–32)
CREATININE: 1.06 mg/dL (ref 0.61–1.24)
Chloride: 107 mmol/L (ref 101–111)
GFR calc Af Amer: >60 mL/min (ref >60)
GLUCOSE: 98 mg/dL (ref 65–99)
Potassium: 3.9 mmol/L (ref 3.5–5.1)
Sodium: 140 mmol/L (ref 135–145)

## 2016-05-07 LAB — CBC
HCT: 47.2 % (ref 39.0–52.0)
Hemoglobin: 16.3 g/dL (ref 13.0–17.0)
MCH: 30.7 pg (ref 26.0–34.0)
MCHC: 34.5 g/dL (ref 30.0–36.0)
MCV: 88.9 fL (ref 78.0–100.0)
PLATELETS: 320 10*3/uL (ref 150–400)
RBC: 5.31 MIL/uL (ref 4.22–5.81)
RDW: 13.1 % (ref 11.5–15.5)
WBC: 8.5 10*3/uL (ref 4.0–10.5)

## 2016-05-07 LAB — I-STAT TROPONIN, ED
TROPONIN I, POC: 0 ng/mL (ref 0.00–0.08)
Troponin i, poc: 0 ng/mL (ref 0.00–0.08)

## 2016-05-07 MED ORDER — OMEPRAZOLE 20 MG PO CPDR: DELAYED_RELEASE_CAPSULE | ORAL | 0 refills | Status: AC

## 2016-05-07 NOTE — ED Provider Notes (Signed)
[facility]-EMERGENCY DEPT Provider Note   CSN: [id number] Arrival date & time: 05/07/16  1642  By signing my name below, I, Vista Mink, attest that this documentation has been prepared under the direction and in the presence of Simrat Kendrick PA-C.  Electronically Signed: Vista Mink, ED Scribe. 05/07/16. 10:19 PM.  History   Chief Complaint Chief Complaint  Patient presents with  . Chest Pain    HPI HPI Comments: Anthony Livingston is a 45 y.o. Male with no pertinent PMHx, who presents to the Emergency Department complaining of intermittent left sided chest pain that started two weeks ago. Pt reports that the pain is usually exacerbated by movement. Pt also states that "the pain eventually goes away when I lay on my right side". Pt states the pain initially started in his left axillary region but has moved towards the left lateral side of his chest wall. Pt reports that he had pain on arrival to the ED but states that he is in no pain currently. Pt also states that he recently started working out again 6 weeks ago and believes that the pain could be due to increase in physical activity. Pt denies any current PCP. Pt denies SOB, dizziness, weakness, leg pain, fever, nausea or vomiting.   The history is provided by the patient. No language interpreter was used.    History reviewed. No pertinent past medical history.  There are no active problems to display for this patient.   Past Surgical History:  Procedure Laterality Date  . HAND SURGERY    . HERNIA REPAIR         Home Medications    Prior to Admission medications   Medication Sig Start Date End Date Taking? Authorizing Provider  cyclobenzaprine (FLEXERIL) 10 MG tablet Take 1 tablet (10 mg total) by mouth 2 (two) times daily as needed for muscle spasms. 11/27/13   Marissa Sciacca, PA-C    Family History Family History  Problem Relation Age of Onset  . Hypertension Other   . Diabetes Other     Social History Social History    Substance Use Topics  . Smoking status: Never Smoker  . Smokeless tobacco: Current User    Types: Chew  . Alcohol use Yes     Comment: rare     Allergies   Review of patient's allergies indicates no known allergies.   Review of Systems Review of Systems  Constitutional: Negative for fever.  Respiratory: Negative for shortness of breath.   Cardiovascular: Positive for chest pain (left sided chest wall).  Gastrointestinal: Negative for nausea and vomiting.  Neurological: Negative for dizziness and weakness.  All other systems reviewed and are negative.    Physical Exam Updated Vital Signs BP 137/86 (BP Location: Left Arm)   Pulse 69   Temp 98.8 F (37.1 C) (Oral)   Resp 16   Ht 5\' 7"  (1.702 m)   Wt 195 lb (88.5 kg)   SpO2 97%   BMI 30.54 kg/m   Physical Exam  Constitutional: He is oriented to person, place, and time. He appears well-developed and well-nourished. No distress.  HENT:  Head: Normocephalic and atraumatic.  Neck: Normal range of motion.  Cardiovascular: Normal rate, regular rhythm and normal heart sounds.   No murmur heard. Pulmonary/Chest: Effort normal and breath sounds normal. He has no wheezes. He exhibits no tenderness.  Neurological: He is alert and oriented to person, place, and time.  Skin: Skin is warm and dry. He is not diaphoretic.  Psychiatric: He  has a normal mood and affect. Judgment normal.  Nursing note and vitals reviewed.    ED Treatments / Results  DIAGNOSTIC STUDIES: Oxygen Saturation is 97% on RA, normal by my interpretation.  COORDINATION OF CARE: 10:18 PM-Will discuss EKG with attending. If no significant findings, will discharge. Discussed treatment plan with pt at bedside and pt agreed to plan.   Labs (all labs ordered are listed, but only abnormal results are displayed) Labs Reviewed  BASIC METABOLIC PANEL  CBC  I-STAT TROPOININ, ED   Results for orders placed or performed during the hospital encounter of 05/07/16   Basic metabolic panel  Result Value Ref Range   Sodium 140 135 - 145 mmol/L   Potassium 3.9 3.5 - 5.1 mmol/L   Chloride 107 101 - 111 mmol/L   CO2 25 22 - 32 mmol/L   Glucose, Bld 98 65 - 99 mg/dL   BUN 16 6 - 20 mg/dL   Creatinine, Ser 1.61 0.61 - 1.24 mg/dL   Calcium 09.6 8.9 - 04.5 mg/dL   GFR calc non Af Amer >60 >60 mL/min   GFR calc Af Amer >60 >60 mL/min   Anion gap 8 5 - 15  CBC  Result Value Ref Range   WBC 8.5 4.0 - 10.5 K/uL   RBC 5.31 4.22 - 5.81 MIL/uL   Hemoglobin 16.3 13.0 - 17.0 g/dL   HCT 40.9 81.1 - 91.4 %   MCV 88.9 78.0 - 100.0 fL   MCH 30.7 26.0 - 34.0 pg   MCHC 34.5 30.0 - 36.0 g/dL   RDW 78.2 95.6 - 21.3 %   Platelets 320 150 - 400 K/uL  I-stat troponin, ED  Result Value Ref Range   Troponin i, poc 0.00 0.00 - 0.08 ng/mL   Comment 3          I-Stat Troponin, ED (not at [facility])  Result Value Ref Range   Troponin i, poc 0.00 0.00 - 0.08 ng/mL   Comment 3            EKG  EKG Interpretation None       Radiology Dg Chest 2 View  Result Date: 05/07/2016 CLINICAL DATA:  Initial evaluation for acute left-sided chest pain for 2-3 weeks. EXAM: CHEST  2 VIEW COMPARISON:  None. FINDINGS: Cardiac and mediastinal silhouettes are within normal limits. Lungs are normally inflated. No focal infiltrate, pulmonary edema, or pleural effusion. No pneumothorax. No acute osseous abnormality. Degenerative changes noted about the right shoulder. IMPRESSION: No active cardiopulmonary disease. Electronically Signed   By: Rise Mu M.D.   On: 05/07/2016 17:25    Procedures Procedures (including critical care time)  Medications Ordered in ED Medications - No data to display   Initial Impression / Assessment and Plan / ED Course  I have reviewed the triage vital signs and the nursing notes.  Pertinent labs & imaging results that were available during my care of the patient were reviewed by me and considered in my medical decision making (see chart for  details).  Clinical Course    Patient with left lateral chest pain atypical for cardiac pain, x 2 weeks. Labs, including delta troponin, negative. CXR clear. Heart score of 1. EKG with flipped T-waves - no previous comparable EKG. Doubt ACS, and he is stable for discharge home. Will refer to cardiology for evaluation of abnormal EKG.   Final Clinical Impressions(s) / ED Diagnoses   Final diagnoses:  None  1. Nonspecific chest pain  New Prescriptions New  Prescriptions   No medications on file  I personally performed the services described in this documentation, which was scribed in my presence. The recorded information has been reviewed and is accurate.     Elpidio AnisShari Anette Barra, PA-C 05/07/16 2349    Lavera Guiseana Duo Liu, MD 05/08/16 [phone]

## 2016-05-07 NOTE — ED Triage Notes (Signed)
Pt presents to the ED with throbbing chest pain on left side from pectoralis to left armpit line. Pt reports lethargy. Pt denies SOB, dizziness, weakness, sweating, N&V. Pt stated that this started about 2-3 weeks ago when he started going back to the gym. Pt states that the pain comes and goes. Pt reports heart burn last week for three nights in a row. Pt thinks that it could possibly be a pulled muscle but is unsure. Pt reports black stools for about 3-4 weeks but pt reports taking supplements for the gym & increase in spinach consumption.

## 2016-05-07 NOTE — ED Notes (Signed)
PA at bedside.

## 2016-05-07 NOTE — ED Notes (Signed)
Pt given sandwich and juice

## 2016-05-08 NOTE — ED Notes (Signed)
Pt ambulatory and independent at discharge.  Verbalized understanding of discharge instructions
